# Patient Record
Sex: Female | Born: 2000 | Race: White | Hispanic: No | Marital: Single
Health system: Southern US, Community
[De-identification: ages and names within clinical notes are randomized; demographics above are authoritative.]

## PROBLEM LIST (undated history)

## (undated) DIAGNOSIS — H5213 Myopia, bilateral: Secondary | ICD-10-CM

## (undated) HISTORY — PX: CYSTOPLASTY: SHX475

## (undated) HISTORY — DX: Myopia, bilateral: H52.13

---
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
## 2022-02-03 ENCOUNTER — Ambulatory Visit (INDEPENDENT_AMBULATORY_CARE_PROVIDER_SITE_OTHER): Payer: BC Managed Care – PPO | Admitting: Nurse Practitioner

## 2022-02-03 ENCOUNTER — Other Ambulatory Visit: Payer: Self-pay

## 2022-02-03 ENCOUNTER — Encounter: Payer: Self-pay | Admitting: Nurse Practitioner

## 2022-02-03 VITALS — BP 116/74

## 2022-02-03 DIAGNOSIS — Z7251 High risk heterosexual behavior: Secondary | ICD-10-CM | POA: Diagnosis not present

## 2022-02-03 DIAGNOSIS — N644 Mastodynia: Secondary | ICD-10-CM

## 2022-02-03 DIAGNOSIS — R110 Nausea: Secondary | ICD-10-CM | POA: Diagnosis not present

## 2022-02-03 NOTE — Progress Notes (Signed)
? ?  Acute Office Visit ? ?Subjective:  ? ? Patient ID: Michelle Wood, female    DOB: 2001/01/02, 21 y.o.   MRN: [id number] ? ? ?HPI ?21 y.o. presents today for pregnancy-like symptoms. She has been sexually active with new partner. Used condom initially, but had unprotected intercourse 01/09/2022. About a week later she developed very tender breasts, including nipple and areolas. She then began having nausea and food aversions. She even vomited a couple of times after eating. Symptoms have not subsided and she feels breast tenderness is actually worse. Nexplanon in place, inserted 03/2021. Negative home UPT a couple of weeks ago.  ? ? ?Review of Systems  ?Constitutional:  Positive for fatigue.  ?Gastrointestinal:  Positive for nausea and vomiting.  ?     Appetite changes  ?Breasts: bilateral breast tenderness, negative for nipple discharge, lumps, skin changes, redness, or swelling ?   ?Objective:  ?  ?Physical Exam ?Constitutional:   ?   Appearance: Normal appearance.  ?Chest:  ?Breasts: ?   Right: Tenderness present. No mass or nipple discharge.  ?   Left: Tenderness present. No mass or nipple discharge.  ?GU: Not indicated ? ?BP 116/74  ?Wt Readings from Last 3 Encounters:  ?08/14/21 93 lb (42.2 kg) (<1 %, Z= -2.44)*  ?02/01/21 95 lb (43.1 kg) (2 %, Z= -2.16)*  ?01/07/21 95 lb (43.1 kg) (2 %, Z= -2.14)*  ? ?* Growth percentiles are based on CDC (Girls, 2-20 Years) data.  ? ? ?   ? ?Patient informed chaperone available to be present for breast and pelvic exam. Patient has requested no chaperone to be present. Patient has been advised what will be completed during breast and pelvic exam.  ? ?Assessment & Plan:  ? ?Problem List Items Addressed This Visit   ?None ?Visit Diagnoses   ? ? Breast tenderness in female    -  Primary  ? Relevant Orders  ? hCG, quantitative, pregnancy  ? Nausea      ? Relevant Orders  ? hCG, quantitative, pregnancy  ? Unprotected sexual intercourse      ? Relevant Orders  ? hCG, quantitative,  pregnancy  ? ?  ? ?Plan: Will check hCG today. Discussed side effects that can occur with Nexplanon and other causes for nausea and vomiting.  ? ? ? ? ?Olivia Mackie DNP, 11:15 AM 02/03/2022 ? ?

## 2022-02-04 LAB — HCG, QUANTITATIVE, PREGNANCY: HCG, Total, QN: <3 m[IU]/mL

## 2022-04-10 ENCOUNTER — Encounter: Payer: Self-pay | Admitting: Emergency Medicine

## 2022-04-10 ENCOUNTER — Ambulatory Visit
Admission: EM | Admit: 2022-04-10 | Discharge: 2022-04-10 | Disposition: A | Discharge status: Home / Self Care | Payer: BC Managed Care – PPO | Attending: Emergency Medicine | Admitting: Emergency Medicine

## 2022-04-10 DIAGNOSIS — J310 Chronic rhinitis: Secondary | ICD-10-CM | POA: Diagnosis not present

## 2022-04-10 DIAGNOSIS — R52 Pain, unspecified: Secondary | ICD-10-CM | POA: Diagnosis not present

## 2022-04-10 DIAGNOSIS — B349 Viral infection, unspecified: Secondary | ICD-10-CM | POA: Diagnosis not present

## 2022-04-10 DIAGNOSIS — J329 Chronic sinusitis, unspecified: Secondary | ICD-10-CM | POA: Insufficient documentation

## 2022-04-10 DIAGNOSIS — Z20818 Contact with and (suspected) exposure to other bacterial communicable diseases: Secondary | ICD-10-CM | POA: Insufficient documentation

## 2022-04-10 MED ORDER — FLUTICASONE PROPIONATE 50 MCG/ACT NA SUSP: 1.0000 | Freq: Every day | NASAL | 1 refills | Status: DC

## 2022-04-10 NOTE — ED Provider Notes (Signed)
[facility]    CSN: [id number] Arrival date & time: 04/10/22  1217    HISTORY   Chief Complaint  Patient presents with   Sore Throat   Generalized Body Aches   HPI Michelle Wood is a 21 y.o. female. Patient presents urgent care today complaining of a sore, scratchy throat, nausea without vomiting, and body aches.  Patient states she has had multiple exposures to strep and pneumonia where she works in [employer].  Patient denies difficulty swallowing, fever, chills, otalgia, cough, diarrhea.  Patient denies history of seasonal allergies.  The history is provided by the patient.  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 Authorizing Provider  etonogestrel (NEXPLANON) 68 MG IMPL implant 1 each by Subdermal route once. Inserted 04/12/2021    [provider]  valACYclovir (VALTREX) 500 MG tablet Take 1 tablet (500 mg total) by mouth daily. 01/07/21   Olivia Mackie, NP   Family History Family History  Problem Relation Age of Onset   Delayed puberty Mother    Other Father        Birt-Hogg-Dub syndrome   Other Paternal Grandmother        Birt-Hogg-Dub syndrome   Social History Social History   Tobacco Use   Smoking status: Never   Smokeless tobacco: Never  Vaping Use   Vaping Use: Every day  Substance Use Topics   Alcohol use: Not Currently   Drug use: Not Currently    Allergies   Patient has no known allergies.  Review of Systems Review of Systems Pertinent findings noted in history of present illness.   Physical Exam Triage Vital Signs ED Triage Vitals  Enc Vitals Group     BP 09/13/21 0827 (!) 147/82     Pulse Rate 09/13/21 0827 72     Resp 09/13/21 0827 18     Temp 09/13/21 0827 98.3 F (36.8 C)     Temp Source 09/13/21 0827 Oral     SpO2 09/13/21 0827 98 %     Weight --      Height --      Head Circumference --      Peak Flow --      Pain Score 09/13/21 0826 5     Pain Loc --      Pain Edu? --      Excl. in GC? --   No data found.  Updated Vital Signs BP 108/71   Pulse (!) 110   Temp 98.2 F (36.8 C) (Oral)   Resp 20   SpO2 98%   Physical Exam Vitals and nursing note reviewed.  Constitutional:  General: She is not in acute distress.    Appearance: Normal appearance. She is not ill-appearing.  HENT:     Head: Normocephalic and atraumatic.     Salivary Glands: Right salivary gland is not diffusely enlarged or tender. Left salivary gland is not diffusely enlarged or tender.     Right Ear: Ear canal and external ear normal. No drainage. A middle ear effusion is present. There is no impacted cerumen. Tympanic membrane is bulging. Tympanic membrane is not injected or erythematous.     Left Ear: Ear canal and external ear normal. No drainage. A middle ear effusion is present. There is no impacted cerumen. Tympanic membrane is bulging. Tympanic membrane is not injected or erythematous.     Ears:     Comments: Bilateral EACs normal, both TMs bulging with clear fluid    Nose: Rhinorrhea present. No nasal deformity, septal deviation, signs of injury, nasal tenderness, mucosal edema or congestion. Rhinorrhea is clear.     Right Nostril: Occlusion present. No foreign body, epistaxis or septal hematoma.     Left Nostril: Occlusion present. No foreign body, epistaxis or septal hematoma.     Right Turbinates: Enlarged, swollen and  pale.     Left Turbinates: Enlarged, swollen and pale.     Right Sinus: No maxillary sinus tenderness or frontal sinus tenderness.     Left Sinus: No maxillary sinus tenderness or frontal sinus tenderness.     Mouth/Throat:     Lips: Pink. No lesions.     Mouth: Mucous membranes are moist. No oral lesions.     Pharynx: Oropharynx is clear. Uvula midline. Posterior oropharyngeal erythema present. No pharyngeal swelling, oropharyngeal exudate or uvula swelling.     Tonsils: No tonsillar exudate. 1+ on the right. 1+ on the left.     Comments: Postnasal drip Eyes:     General: Lids are normal.        Right eye: No discharge.        Left eye: No discharge.     Extraocular Movements: Extraocular movements intact.     Conjunctiva/sclera: Conjunctivae normal.     Right eye: Right conjunctiva is not injected.     Left eye: Left conjunctiva is not injected.  Neck:     Trachea: Trachea and phonation normal.  Cardiovascular:     Rate and Rhythm: Normal rate and regular rhythm.     Pulses: Normal pulses.     Heart sounds: Normal heart sounds. No murmur heard.   No friction rub. No gallop.  Pulmonary:     Effort: Pulmonary effort is normal. No accessory muscle usage, prolonged expiration or respiratory distress.     Breath sounds: Normal breath sounds. No stridor, decreased air movement or transmitted upper airway sounds. No decreased breath sounds, wheezing, rhonchi or rales.  Chest:     Chest wall: No tenderness.  Musculoskeletal:        General: Normal range of motion.     Cervical back: Normal range of motion and neck supple. Normal range of motion.  Lymphadenopathy:     Cervical: No cervical adenopathy.  Skin:    General: Skin is warm and dry.     Findings: No erythema or rash.  Neurological:     General: No focal deficit present.     Mental Status: She is alert and oriented to person, place, and time.  Psychiatric:        Mood and Affect: Mood normal.  Behavior: Behavior  normal.    Visual Acuity Right Eye Distance:   Left Eye Distance:   Bilateral Distance:    Right Eye Near:   Left Eye Near:    Bilateral Near:     UC Couse / Diagnostics / Procedures:    EKG  Radiology No results found.  Procedures Procedures (including critical care time)  UC Diagnoses / Final Clinical Impressions(s)   I have reviewed the triage vital signs and the nursing notes.  Pertinent labs & imaging results that were available during my care of the patient were reviewed by me and considered in my medical decision making (see chart for details).   Final diagnoses:  Rhinosinusitis  Generalized body aches  Exposure to Streptococcal pharyngitis  Viral illness   Patient advised that her symptoms are likely due to viral illness.  Prescription for Flonase provided for rhino sinusitis, postnasal drip.  Patient advised to monitor symptoms for increasing sore throat, hoarseness of voice, difficulty swallowing.  Rapid strep test today is negative, throat culture will be performed.  We will notify patient of results once received, antibiotics to be provided as needed.  ED Prescriptions     Medication Sig Dispense Auth. Provider   fluticasone (FLONASE) 50 MCG/ACT nasal spray Place 1 spray into both nostrils daily. Begin by using 2 sprays in each nare daily for 3 to 5 days, then decrease to 1 spray in each nare daily. 16 mL Theadora Rama Scales, PA-C      PDMP not reviewed this encounter.  Pending results:  Labs Reviewed  CULTURE, GROUP A STREP [facility])  POCT RAPID STREP A (OFFICE)    Medications Ordered in UC: Medications - No data to display  Disposition Upon Discharge:  Condition: stable for discharge home Home: take medications as prescribed; routine discharge instructions as discussed; follow up as advised.  Patient presented with an acute illness with associated systemic symptoms and significant discomfort requiring urgent management. In my opinion, this is a  condition that a prudent lay person (someone who possesses an average knowledge of health and medicine) may potentially expect to result in complications if not addressed urgently such as respiratory distress, impairment of bodily function or dysfunction of bodily organs.   Routine symptom specific, illness specific and/or disease specific instructions were discussed with the patient and/or caregiver at length.   As such, the patient has been evaluated and assessed, work-up was performed and treatment was provided in alignment with urgent care protocols and evidence based medicine.  Patient/parent/caregiver has been advised that the patient may require follow up for further testing and treatment if the symptoms continue in spite of treatment, as clinically indicated and appropriate.  If the patient was tested for COVID-19, Influenza and/or RSV, then the patient/parent/guardian was advised to isolate at home pending the results of his/her diagnostic coronavirus test and potentially longer if they're positive. I have also advised pt that if his/her COVID-19 test returns positive, it's recommended to self-isolate for at least 10 days after symptoms first appeared AND until fever-free for 24 hours without fever reducer AND other symptoms have improved or resolved. Discussed self-isolation recommendations as well as instructions for household member/close contacts as per the [facility] and [place] DHHS, and also gave patient the COVID packet with this information.  Patient/parent/caregiver has been advised to return to the [facility] or PCP in 3-5 days if no better; to PCP or the Emergency Department if new signs and symptoms develop, or if the current signs or symptoms continue  to change or worsen for further workup, evaluation and treatment as clinically indicated and appropriate  The patient will follow up with their current PCP if and as advised. If the patient does not currently have a PCP we will assist them in obtaining  one.   The patient may need specialty follow up if the symptoms continue, in spite of conservative treatment and management, for further workup, evaluation, consultation and treatment as clinically indicated and appropriate.  Patient/parent/caregiver verbalized understanding and agreement of plan as discussed.  All questions were addressed during visit.  Please see discharge instructions below for further details of plan.  Discharge Instructions:   Discharge Instructions      Your strep test today is negative.  Streptococcal throat culture will be performed per our protocol.  The result of your throat culture will be posted to your MyChart once it is complete, this typically takes 3 to 5 days.     If your streptococcal throat culture is positive, you will be contacted by phone and antibiotics will be prescribed for you.     Please see the list below for recommended medications, dosages and frequencies to provide relief of your current symptoms:    Advil, Motrin (ibuprofen): This is a good anti-inflammatory medication which addresses aches, pains and inflammation of the upper airways that causes sinus and nasal congestion as well as in the lower airways which makes your cough feel tight and sometimes burn.  I recommend that you take between 400 to 600 mg every 6-8 hours as needed.      Flonase (fluticasone): This is a steroid nasal spray that you use once daily, 1 spray in each nare.  After 3 to 5 days of use, you will have significant improvement of the inflammation and mucus production that is being caused by exposure to allergens and/or viral infection.  This medication can be purchased over-the-counter however I have provided you with a prescription.      Please follow-up within the next 5-7 days either with your primary care provider or urgent care if your symptoms do not resolve.  If you do not have a primary care provider, we will assist you in finding one.   Thank you for visiting  urgent care today.  We appreciate the opportunity to participate in your care.       This office note has been dictated using Teaching laboratory technician.  Unfortunately, and despite my best efforts, this method of dictation can sometimes lead to occasional typographical or grammatical errors.  I apologize in advance if this occurs.     Theadora Rama Scales, PA-C 04/10/22 1307

## 2022-04-10 NOTE — Discharge Instructions (Addendum)
Your strep test today is negative.  Streptococcal throat culture will be performed per our protocol.  The result of your throat culture will be posted to your MyChart once it is complete, this typically takes 3 to 5 days.     If your streptococcal throat culture is positive, you will be contacted by phone and antibiotics will be prescribed for you.     Please see the list below for recommended medications, dosages and frequencies to provide relief of your current symptoms:    Advil, Motrin (ibuprofen): This is a good anti-inflammatory medication which addresses aches, pains and inflammation of the upper airways that causes sinus and nasal congestion as well as in the lower airways which makes your cough feel tight and sometimes burn.  I recommend that you take between 400 to 600 mg every 6-8 hours as needed.      Flonase (fluticasone): This is a steroid nasal spray that you use once daily, 1 spray in each nare.  After 3 to 5 days of use, you will have significant improvement of the inflammation and mucus production that is being caused by exposure to allergens and/or viral infection.  This medication can be purchased over-the-counter however I have provided you with a prescription.      Please follow-up within the next 5-7 days either with your primary care provider or urgent care if your symptoms do not resolve.  If you do not have a primary care provider, we will assist you in finding one.   Thank you for visiting urgent care today.  We appreciate the opportunity to participate in your care.

## 2022-04-10 NOTE — ED Triage Notes (Signed)
Pt here with sore throat and body aches. 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

## 2022-04-13 LAB — CULTURE, GROUP A STREP (THRC)

## 2022-04-29 ENCOUNTER — Encounter: Payer: Self-pay | Admitting: Nurse Practitioner

## 2022-04-29 ENCOUNTER — Ambulatory Visit (INDEPENDENT_AMBULATORY_CARE_PROVIDER_SITE_OTHER): Payer: BC Managed Care – PPO | Admitting: Nurse Practitioner

## 2022-04-29 VITALS — BP 112/76

## 2022-04-29 DIAGNOSIS — N921 Excessive and frequent menstruation with irregular cycle: Secondary | ICD-10-CM | POA: Diagnosis not present

## 2022-04-29 DIAGNOSIS — Z975 Presence of (intrauterine) contraceptive device: Secondary | ICD-10-CM | POA: Diagnosis not present

## 2022-04-29 DIAGNOSIS — Z3009 Encounter for other general counseling and advice on contraception: Secondary | ICD-10-CM

## 2022-04-29 NOTE — Progress Notes (Signed)
   Acute Office Visit  Subjective:    Patient ID: Michelle Wood, female    DOB: [birth year], 21 y.o.   MRN: [id number]   HPI 21 y.o. presents today to discuss removal of Nexplanon. Nexplanon inserted 03/2021. She has been having random bleeding and cramping. Bleeding is light but occurs often. Not currently sexually active and wants to take a break from hormonal contraception but wants to discuss options. Tried OCPs in the past but missed doses, did not like Nuvaring.    Review of Systems  Constitutional: Negative.   Genitourinary:  Positive for menstrual problem.       Objective:    Physical Exam Constitutional:      Appearance: Normal appearance.   GU: Not indicated  BP 112/76  Wt Readings from Last 3 Encounters:  08/14/21 93 lb (42.2 kg) (<1 %, Z= -2.44)*  02/01/21 95 lb (43.1 kg) (2 %, Z= -2.16)*  01/07/21 95 lb (43.1 kg) (2 %, Z= -2.14)*   * Growth percentiles are based on CDC (Girls, 2-20 Years) data.        Patient informed chaperone available to be present for breast and/or pelvic exam. Patient has requested no chaperone to be present. Patient has been advised what will be completed during breast and pelvic exam.   Assessment & Plan:   Problem List Items Addressed This Visit   None Visit Diagnoses     Breakthrough bleeding on Nexplanon    -  Primary   Relevant Orders   Removal of implanon rod   General counseling and advice on female contraception          Plan: Will schedule appointment for Nexplanon removal. Contraceptive options were reviewed, including hormonal methods, both combination (pill, patch, vaginal ring) and progesterone-only (pill, Depo Provera and Nexplanon), intrauterine devices (Mirena, [place], [place], and [place]), Phexxi, barrier methods (condoms, diaphragm) and female/female sterilization. The mechanisms, risks, benefits and side effects of all methods were discussed.  She is interested in Depo but does not want to start  now.     Olivia Mackie DNP, 11:34 AM 04/29/2022
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
## 2022-05-19 ENCOUNTER — Ambulatory Visit: Payer: BC Managed Care – PPO | Admitting: Nurse Practitioner

## 2022-06-23 ENCOUNTER — Ambulatory Visit: Payer: BC Managed Care – PPO | Admitting: Nurse Practitioner

## 2022-08-04 ENCOUNTER — Ambulatory Visit: Payer: BC Managed Care – PPO | Admitting: Nurse Practitioner

## 2022-08-18 ENCOUNTER — Encounter: Payer: Self-pay | Admitting: Nurse Practitioner

## 2022-08-18 ENCOUNTER — Ambulatory Visit (INDEPENDENT_AMBULATORY_CARE_PROVIDER_SITE_OTHER): Payer: BC Managed Care – PPO | Admitting: Nurse Practitioner

## 2022-08-18 VITALS — BP 98/62 | HR 110 | Ht 62.5 in | Wt 105.0 lb

## 2022-08-18 DIAGNOSIS — Z01419 Encounter for gynecological examination (general) (routine) without abnormal findings: Secondary | ICD-10-CM

## 2022-08-18 DIAGNOSIS — Z113 Encounter for screening for infections with a predominantly sexual mode of transmission: Secondary | ICD-10-CM | POA: Diagnosis not present

## 2022-08-18 DIAGNOSIS — Z3046 Encounter for surveillance of implantable subdermal contraceptive: Secondary | ICD-10-CM

## 2022-08-18 NOTE — Progress Notes (Signed)
   TIMBER MARSHMAN 09-Dec-2000 [id number]   History:  21 y.o. G0 presents for annual exam. Nexplanon inserted May 2022. Was considering removal due to irregular bleeding. She has decided to keep in place for now. She does experience spotting for weeks at a time sometimes. History of HSV - taking Valtrex as needed, chlamydia (11/2020).    Gynecologic History No LMP recorded. Patient has had an implant.   Contraception/Family planning: Nexplanon Sexually active: Yes  Health Maintenance Last Pap: Not indicated Last mammogram: Not indicated Last colonoscopy: Not indicated Last Dexa: Not indicated  Past medical history, past surgical history, family history and social history were all reviewed and documented in the EPIC chart. 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   ROS:  A ROS was performed and pertinent positives and negatives are included.  Exam:  Vitals:   08/18/22 1532  BP: 98/62  Pulse: (!) 110  SpO2: 99%  Weight: 105 lb (47.6 kg)  Height: 5' 2.5" (1.588 m)    Body mass index is 18.9 kg/m.  General appearance:  Normal Thyroid:  Symmetrical, normal in size, without palpable masses or nodularity. Respiratory  Auscultation:  Clear without wheezing or rhonchi Cardiovascular  Auscultation:  Regular rate, without rubs, murmurs or gallops  Edema/varicosities:  Not grossly evident Abdominal  Soft,nontender, without masses, guarding or rebound.  Liver/spleen:  No organomegaly noted  Hernia:  None appreciated  Skin  Inspection:  Grossly normal Breasts: Not indicated per guidelines Genitourinary   Inguinal/mons:  Normal without inguinal adenopathy  External genitalia:  Normal appearing vulva with no masses, tenderness, or lesions  BUS/Urethra/Skene's glands:  Normal  Vagina:  Normal appearing with normal color and discharge, no lesions  Cervix:  Normal appearing without discharge or lesions  Uterus:  Normal in size, shape and contour.  Midline and mobile,  nontender  Adnexa/parametria:     Rt: Normal in size, without masses or tenderness.   Lt: Normal in size, without masses or tenderness.  Anus and perineum: Normal  Digital rectal exam: Not indicated  Patient informed chaperone available to be present for breast and pelvic exam. Patient has requested no chaperone to be present. Patient has been advised what will be completed during breast and pelvic exam.   Assessment/Plan:  21 y.o. G0 for annual exam.   Well female exam with routine gynecological exam - Education provided on SBEs, importance of preventative screenings, current guidelines, high calcium diet, regular exercise, safe sex, and multivitamin daily.   Encounter for surveillance of implantable subdermal contraceptive - Nexplanon inserted 03/2021. Was considering removal due to irregular bleeding. She has decided to keep in place for now. She does experience spotting for weeks at a time sometimes. Discussed option for low dose COC for prolonged bleeding.   Screen for STD (sexually transmitted disease) - Plan: SURESWAB CT/NG/T. vaginalis, RPR, HIV Antibody (routine testing w rflx).  Return in 1 year for annual.     Tamela Gammon DNP, 4:00 PM 08/18/2022

## 2022-08-19 LAB — SURESWAB CT/NG/T. VAGINALIS
C. trachomatis RNA, TMA: NOT DETECTED
N. gonorrhoeae RNA, TMA: NOT DETECTED
Trichomonas vaginalis RNA: NOT DETECTED

## 2022-08-19 LAB — HIV ANTIBODY (ROUTINE TESTING W REFLEX): HIV 1&2 Ab, 4th Generation: NONREACTIVE

## 2022-08-19 LAB — RPR: RPR Ser Ql: NONREACTIVE

## 2023-06-25 ENCOUNTER — Ambulatory Visit (INDEPENDENT_AMBULATORY_CARE_PROVIDER_SITE_OTHER): Payer: 59 | Admitting: Nurse Practitioner

## 2023-06-25 ENCOUNTER — Encounter: Payer: Self-pay | Admitting: Nurse Practitioner

## 2023-06-25 VITALS — BP 118/72 | HR 61

## 2023-06-25 DIAGNOSIS — Z975 Presence of (intrauterine) contraceptive device: Secondary | ICD-10-CM

## 2023-06-25 DIAGNOSIS — Z3202 Encounter for pregnancy test, result negative: Secondary | ICD-10-CM | POA: Diagnosis not present

## 2023-06-25 DIAGNOSIS — Z3201 Encounter for pregnancy test, result positive: Secondary | ICD-10-CM

## 2023-06-25 LAB — PREGNANCY, URINE: Preg Test, Ur: NEGATIVE

## 2023-06-25 NOTE — Progress Notes (Signed)
   Acute Office Visit  Subjective:    Patient ID: LEANDA BRAASCH, female    DOB: [birth year], 22 y.o.   MRN: [id number]   HPI 22 y.o. G0 presents today for positive at home pregnancy tests. Took test because she was having cramping, bilateral breast tenderness, aversions to food, and nausea with alcohol. Took initial test 4 days ago, was digital and inconclusive. Then did a pink-dye and blue-dye the next day. Pictures provided today of faint lines for both. No bleeding. Nexplanon in place.   Patient's last menstrual period was 05/17/2023.    Review of Systems  Constitutional: Negative.   Genitourinary: Negative.        Objective:    Physical Exam Constitutional:      Appearance: Normal appearance.   GU: Not indicated  BP 118/72 (BP Location: Right Arm, Patient Position: Sitting, Cuff Size: Normal)   Pulse 61   LMP 05/17/2023   SpO2 98%  Wt Readings from Last 3 Encounters:  08/18/22 105 lb (47.6 kg) (8%, Z= -1.39)*  08/14/21 93 lb (42.2 kg) (<1%, Z= -2.44)*  02/01/21 95 lb (43.1 kg) (2%, Z= -2.16)*   * Growth percentiles are based on CDC (Girls, 2-20 Years) data.        UPT negative  Assessment & Plan:   Problem List Items Addressed This Visit   None Visit Diagnoses     Positive pregnancy test    -  Primary   Relevant Orders   Pregnancy, urine   hCG, quantitative, pregnancy   Nexplanon in place          Plan: Negative UPT in office today. Reassured likely was evaporation lines on at home tests. Serum hCG pending.      Olivia Mackie DNP, 1:26 PM 06/25/2023

## 2023-08-20 ENCOUNTER — Ambulatory Visit (INDEPENDENT_AMBULATORY_CARE_PROVIDER_SITE_OTHER): Payer: 59 | Admitting: Nurse Practitioner

## 2023-08-20 ENCOUNTER — Encounter: Payer: Self-pay | Admitting: Nurse Practitioner

## 2023-08-20 VITALS — BP 124/80 | HR 81 | Ht 63.0 in | Wt 114.0 lb

## 2023-08-20 DIAGNOSIS — Z01419 Encounter for gynecological examination (general) (routine) without abnormal findings: Secondary | ICD-10-CM | POA: Diagnosis not present

## 2023-08-20 DIAGNOSIS — Z113 Encounter for screening for infections with a predominantly sexual mode of transmission: Secondary | ICD-10-CM | POA: Diagnosis not present

## 2023-08-20 DIAGNOSIS — Z3046 Encounter for surveillance of implantable subdermal contraceptive: Secondary | ICD-10-CM

## 2023-08-20 DIAGNOSIS — A6004 Herpesviral vulvovaginitis: Secondary | ICD-10-CM | POA: Diagnosis not present

## 2023-08-20 MED ORDER — VALACYCLOVIR HCL 500 MG PO TABS: 500.0000 mg | ORAL_TABLET | Freq: Every day | ORAL | 3 refills | Status: AC

## 2023-08-20 NOTE — Progress Notes (Addendum)
   Michelle Wood November 15, 2001 [id number]   History:  22 y.o. G0 presents for annual exam. Nexplanon inserted May 2022. History of HSV - taking Valtrex as needed, chlamydia (11/2020).    Gynecologic History No LMP recorded. Patient has had an implant.   Contraception/Family planning: Nexplanon Sexually active: Yes  Health Maintenance Last Pap: Not indicated Last mammogram: Not indicated Last colonoscopy: Not indicated Last Dexa: Not indicated  Past medical history, past surgical history, family history and social history were all reviewed and documented in the EPIC chart. Graduated from [employer] earlier this year. 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   ROS:  A ROS was performed and pertinent positives and negatives are included.  Exam:  Vitals:   08/20/23 1513  BP: 124/80  Pulse: 81  SpO2: 98%  Weight: 114 lb (51.7 kg)  Height: 5\' 3"  (1.6 m)     Body mass index is 20.19 kg/m.  General appearance:  Normal Thyroid:  Symmetrical, normal in size, without palpable masses or nodularity. Respiratory  Auscultation:  Clear without wheezing or rhonchi Cardiovascular  Auscultation:  Regular rate, without rubs, murmurs or gallops  Edema/varicosities:  Not grossly evident Abdominal  Soft,nontender, without masses, guarding or rebound.  Liver/spleen:  No organomegaly noted  Hernia:  None appreciated  Skin  Inspection:  Grossly normal Breasts: Not indicated per guidelines Pelvic: External genitalia:  no lesions              Urethra:  normal appearing urethra with no masses, tenderness or lesions              Bartholins and Skenes: normal                 Vagina: normal appearing vagina with normal color and discharge, no lesions              Cervix: no lesions Bimanual Exam:  Uterus:  no masses or tenderness              Adnexa: no mass, fullness, tenderness              Rectovaginal: Deferred              Anus:  normal, no lesions  Patient informed chaperone available to  be present for breast and pelvic exam. Patient has requested no chaperone to be present. Patient has been advised what will be completed during breast and pelvic exam.   Assessment/Plan:  22 y.o. G0 for annual exam.   Well female exam with routine gynecological exam - Education provided on SBEs, importance of preventative screenings, current guidelines, high calcium diet, regular exercise, safe sex, and multivitamin daily.   Encounter for surveillance of implantable subdermal contraceptive - Nexplanon inserted 03/2021. Happy with nexplanon and plans to exchange in May.   Screen for STD (sexually transmitted disease) - Plan: SURESWAB CT/NG/T. Vaginalis. Declines HIV/RPR.   Herpes simplex vulvovaginitis - Plan: valACYclovir (VALTREX) 500 MG tablet. Takes as needed.   Return in 1 year for annual.     Olivia Mackie DNP, 3:50 PM 08/20/2023

## 2023-08-21 LAB — SURESWAB CT/NG/T. VAGINALIS
C. trachomatis RNA, TMA: NOT DETECTED
N. gonorrhoeae RNA, TMA: NOT DETECTED
Trichomonas vaginalis RNA: NOT DETECTED

## 2023-09-10 ENCOUNTER — Encounter: Payer: Self-pay | Admitting: Nurse Practitioner

## 2023-09-11 ENCOUNTER — Other Ambulatory Visit: Payer: Self-pay | Admitting: Nurse Practitioner

## 2023-09-11 DIAGNOSIS — Z975 Presence of (intrauterine) contraceptive device: Secondary | ICD-10-CM

## 2023-09-11 MED ORDER — NORETHIN ACE-ETH ESTRAD-FE 1-20 MG-MCG PO TABS
1.0000 | ORAL_TABLET | Freq: Every day | ORAL | 0 refills | Status: DC
  Filled 2023-09-11: qty 28, 28d supply, fill #0

## 2023-09-14 ENCOUNTER — Other Ambulatory Visit (HOSPITAL_BASED_OUTPATIENT_CLINIC_OR_DEPARTMENT_OTHER): Payer: Self-pay

## 2023-09-17 ENCOUNTER — Other Ambulatory Visit (HOSPITAL_BASED_OUTPATIENT_CLINIC_OR_DEPARTMENT_OTHER): Payer: Self-pay

## 2023-10-23 ENCOUNTER — Encounter: Payer: Self-pay | Admitting: Nurse Practitioner

## 2023-10-23 NOTE — Telephone Encounter (Signed)
Pt read mychart msg. Routing to provider for review/provide anything additional to advice provided by TG,CMA.

## 2024-01-28 ENCOUNTER — Other Ambulatory Visit (HOSPITAL_COMMUNITY): Payer: Self-pay

## 2024-02-09 ENCOUNTER — Ambulatory Visit: Payer: 59 | Admitting: Nurse Practitioner

## 2024-03-17 ENCOUNTER — Encounter: Payer: Self-pay | Admitting: Nurse Practitioner

## 2024-03-17 ENCOUNTER — Ambulatory Visit: Admitting: Nurse Practitioner

## 2024-03-17 ENCOUNTER — Other Ambulatory Visit: Payer: Self-pay | Admitting: *Deleted

## 2024-03-17 VITALS — BP 114/68 | HR 101

## 2024-03-17 DIAGNOSIS — Z3046 Encounter for surveillance of implantable subdermal contraceptive: Secondary | ICD-10-CM

## 2024-03-17 DIAGNOSIS — Z30016 Encounter for initial prescription of transdermal patch hormonal contraceptive device: Secondary | ICD-10-CM | POA: Diagnosis not present

## 2024-03-17 MED ORDER — NORELGESTROMIN-ETH ESTRADIOL 150-35 MCG/24HR TD PTWK: 1.0000 | MEDICATED_PATCH | TRANSDERMAL | 1 refills | Status: DC

## 2024-03-17 NOTE — Progress Notes (Signed)
 23 y.o. G0P0000 presents for Nexplanon  removal.  She has been experiencing bleeding. She has decided to use patch for future contraception.  Procedure, risks and benefits have all been explained.  She has the following questions today:  none.     LMP:  02/05/2024   After all questions were answered, consent was obtained.    Past Medical History:  Diagnosis Date   Myopia of both eyes     Past Surgical History:  Procedure Laterality Date   CYSTOPLASTY      Current Outpatient Medications on File Prior to Visit  Medication Sig Dispense Refill   valACYclovir  (VALTREX ) 500 MG tablet Take 1 tablet (500 mg total) by mouth daily. 90 tablet 3   No current facility-administered medications on file prior to visit.   No Known Allergies  Vitals:   03/17/24 1415  BP: 114/68  Pulse: (!) 101  SpO2: 99%   Physical Exam   Timeout performed and consent obtained.   Procedure: Patient placed supine on exam table with her left arm flexed at the elbow. The prior insertion site was located and the Nexplanon  rod was palpated.  Area cleansed with Betadine x 3 and draped in normal sterile fashion.  Insertion site and surrounding tissue anesthetized with 1% Lidocaine  with epinephrine, 2cc total used.  Small incision made with #11 blade.  Nexplanon  removed without difficulty.  Steri-strips were applied and pressure dressing placed over the site.  Entire procedure performed with sterile technique.  Pt tolerated procedure well.  Assessment:   Encounter for Nexplanon  removal - Plan: Removal of implanon  rod  Encounter for initial prescription of transdermal patch hormonal contraceptive device - Plan: norelgestromin -ethinyl estradiol  (XULANE) 150-35 MCG/24HR transdermal patch. Educated on proper use.   Plan:  Post procedure instructions reviewed with pt.  Questions answered.  Pt knows to call with any concerns or questions.  New contraception:  Patch

## 2024-03-17 NOTE — Telephone Encounter (Signed)
 Call received from [place] at [facility].   Rx received for Xulane patch, 1 patch weekly, dispense 12 patches. If patient is not going to use continuously, ok to update quantity to #9?

## 2024-03-19 ENCOUNTER — Encounter: Payer: Self-pay | Admitting: Nurse Practitioner

## 2024-03-21 ENCOUNTER — Other Ambulatory Visit (HOSPITAL_BASED_OUTPATIENT_CLINIC_OR_DEPARTMENT_OTHER): Payer: Self-pay

## 2024-03-21 ENCOUNTER — Other Ambulatory Visit: Payer: Self-pay | Admitting: Nurse Practitioner

## 2024-03-21 DIAGNOSIS — Z30011 Encounter for initial prescription of contraceptive pills: Secondary | ICD-10-CM

## 2024-03-21 MED ORDER — NORETHIN ACE-ETH ESTRAD-FE 1-20 MG-MCG PO TABS
1.0000 | ORAL_TABLET | Freq: Every day | ORAL | 1 refills | Status: AC
  Filled 2024-03-21: qty 28, 28d supply, fill #0
  Filled 2024-04-28: qty 84, 84d supply, fill #1

## 2024-03-21 NOTE — Telephone Encounter (Signed)
 AEX 08/20/23 -TW Seen for Nexplanon  removal on 03/17/24, Xulane Rx sent.  Routing to Tiffany to review and advise on alternative.

## 2024-04-28 ENCOUNTER — Other Ambulatory Visit (HOSPITAL_BASED_OUTPATIENT_CLINIC_OR_DEPARTMENT_OTHER): Payer: Self-pay

## 2024-04-28 ENCOUNTER — Other Ambulatory Visit: Payer: Self-pay

## 2024-04-29 ENCOUNTER — Other Ambulatory Visit (HOSPITAL_BASED_OUTPATIENT_CLINIC_OR_DEPARTMENT_OTHER): Payer: Self-pay

## 2024-06-17 ENCOUNTER — Ambulatory Visit: Payer: Self-pay

## 2024-08-30 ENCOUNTER — Ambulatory Visit: Admitting: Nurse Practitioner

## 2024-12-28 ENCOUNTER — Ambulatory Visit: Admitting: Nurse Practitioner
